# Patient Record
Sex: Male | Born: 1965 | Race: White | Hispanic: No | Marital: Married | Smoking: Never smoker
Health system: Southern US, Community
[De-identification: ages and names within clinical notes are randomized; demographics above are authoritative.]

---

## 2020-01-26 ENCOUNTER — Emergency Department (INDEPENDENT_AMBULATORY_CARE_PROVIDER_SITE_OTHER): Payer: 59

## 2020-01-26 ENCOUNTER — Other Ambulatory Visit: Payer: Self-pay

## 2020-01-26 ENCOUNTER — Emergency Department (INDEPENDENT_AMBULATORY_CARE_PROVIDER_SITE_OTHER)
Admission: RE | Admit: 2020-01-26 | Discharge: 2020-01-26 | Disposition: A | Discharge status: Home / Self Care | Payer: 59 | Source: Ambulatory Visit | Attending: Family Medicine | Admitting: Family Medicine

## 2020-01-26 VITALS — BP 163/92 | HR 94 | Temp 97.8°F | Resp 17

## 2020-01-26 DIAGNOSIS — S2241XA Multiple fractures of ribs, right side, initial encounter for closed fracture: Secondary | ICD-10-CM

## 2020-01-26 DIAGNOSIS — W01198A Fall on same level from slipping, tripping and stumbling with subsequent striking against other object, initial encounter: Secondary | ICD-10-CM | POA: Diagnosis not present

## 2020-01-26 MED ORDER — HYDROCODONE-ACETAMINOPHEN 5-325 MG PO TABS: 2.0000 | ORAL_TABLET | ORAL | 0 refills | Status: AC | PRN

## 2020-01-26 NOTE — ED Triage Notes (Signed)
Pt c/o back pain since Monday night. Says he woke up in middle of night, got up super quick and got dizzy falling into side table. Sharp pains with certain movements. Tylenol prn. Pain 10/10

## 2020-01-26 NOTE — ED Provider Notes (Signed)
[facility]    CSN: [id number] Arrival date & time: 01/26/20  3762      History   Chief Complaint Chief Complaint  Patient presents with  . Back Pain    HPI Howard Wilson is a 54 y.o. male.   Patient suffered a fall 2 nights ago after jumping up out of bed.  Fell against the corner of a dresser or bedside table.  Did not sleep well last night due to pain.  Any cough or sudden jarring hurts in that area.  Some pain with deep breath.  Pain does not radiate  HPI  History reviewed. No pertinent past medical history.  There are no problems to display for this patient.   History reviewed. No pertinent surgical history.     Home Medications    Prior to Admission medications   Not on File    Family History History reviewed. No pertinent family history.  Social History Social History   Tobacco Use  . Smoking status: Never Smoker  . Smokeless tobacco: Never Used  Vaping Use  . Vaping Use: Never used  Substance Use Topics  . Alcohol use: Yes    Comment: occ     Allergies   Patient has no known allergies.   Review of Systems Review of Systems  Musculoskeletal: Positive for back pain.       Right posterior lateral rib area  All other systems reviewed and are negative.    Physical Exam Triage Vital Signs ED Triage Vitals  Enc Vitals Group     BP 01/26/20 0938 (!) 163/92     Pulse Rate 01/26/20 0938 94     Resp 01/26/20 0938 17     Temp 01/26/20 0938 97.8 F (36.6 C)     Temp Source 01/26/20 0938 Oral     SpO2 01/26/20 0938 97 %     Weight --      Height --      Head Circumference --      Peak Flow --      Pain Score 01/26/20 0940 10     Pain Loc --      Pain Edu? --      Excl. in GC? --    No data found.  Updated Vital Signs BP (!) 163/92 (BP Location: Right Arm)   Pulse 94   Temp 97.8 F (36.6 C) (Oral)   Resp 17   SpO2 97%   Visual Acuity Right Eye Distance:   Left Eye Distance:   Bilateral Distance:    Right Eye  Near:   Left Eye Near:    Bilateral Near:     Physical Exam Vitals and nursing note reviewed.  Constitutional:      Appearance: Normal appearance.  HENT:     Head: Normocephalic.  Eyes:     Pupils: Pupils are equal, round, and reactive to light.  Cardiovascular:     Rate and Rhythm: Normal rate and regular rhythm.  Pulmonary:     Effort: Pulmonary effort is normal.     Breath sounds: Normal breath sounds.  Musculoskeletal:     Comments: Back: There is bruising noted and right posterior lateral back as well as point tenderness. There is also tenderness with AP compression Breath sounds are normal  Neurological:     Mental Status: He is alert.      UC Treatments / Results  Labs (all labs ordered are listed, but only abnormal results are displayed) Labs Reviewed - No  data to display  EKG   Radiology No results found.  Procedures Procedures (including critical care time)  Medications Ordered in UC Medications - No data to display  Initial Impression / Assessment and Plan / UC Course  I have reviewed the triage vital signs and the nursing notes.  Pertinent labs & imaging results that were available during my care of the patient were reviewed by me and considered in my medical decision making (see chart for details).     Traumatic fracture of seventh eighth ninth ribs on the right Final Clinical Impressions(s) / UC Diagnoses   Final diagnoses:  None   Discharge Instructions   None    ED Prescriptions    None     PDMP not reviewed this encounter.   Frederica Kuster, MD 01/26/20 1021

## 2020-02-01 ENCOUNTER — Telehealth: Payer: Self-pay | Admitting: Emergency Medicine

## 2020-02-01 MED ORDER — DICLOFENAC SODIUM 75 MG PO TBEC: 75.0000 mg | DELAYED_RELEASE_TABLET | Freq: Two times a day (BID) | ORAL | 0 refills | Status: AC

## 2020-02-01 NOTE — Telephone Encounter (Signed)
Patient is calling for refill of Norco.  Last filled Dec 15.

## 2022-05-30 IMAGING — DX DG RIBS W/ CHEST 3+V*R*
3 series · 3 of 3 positions shown · non-contrast
Comparison: None.

CLINICAL DATA: fall pain R posterolateral rib(s)

EXAM:
RIGHT RIBS AND CHEST - 3+ VIEW

[chest pa]
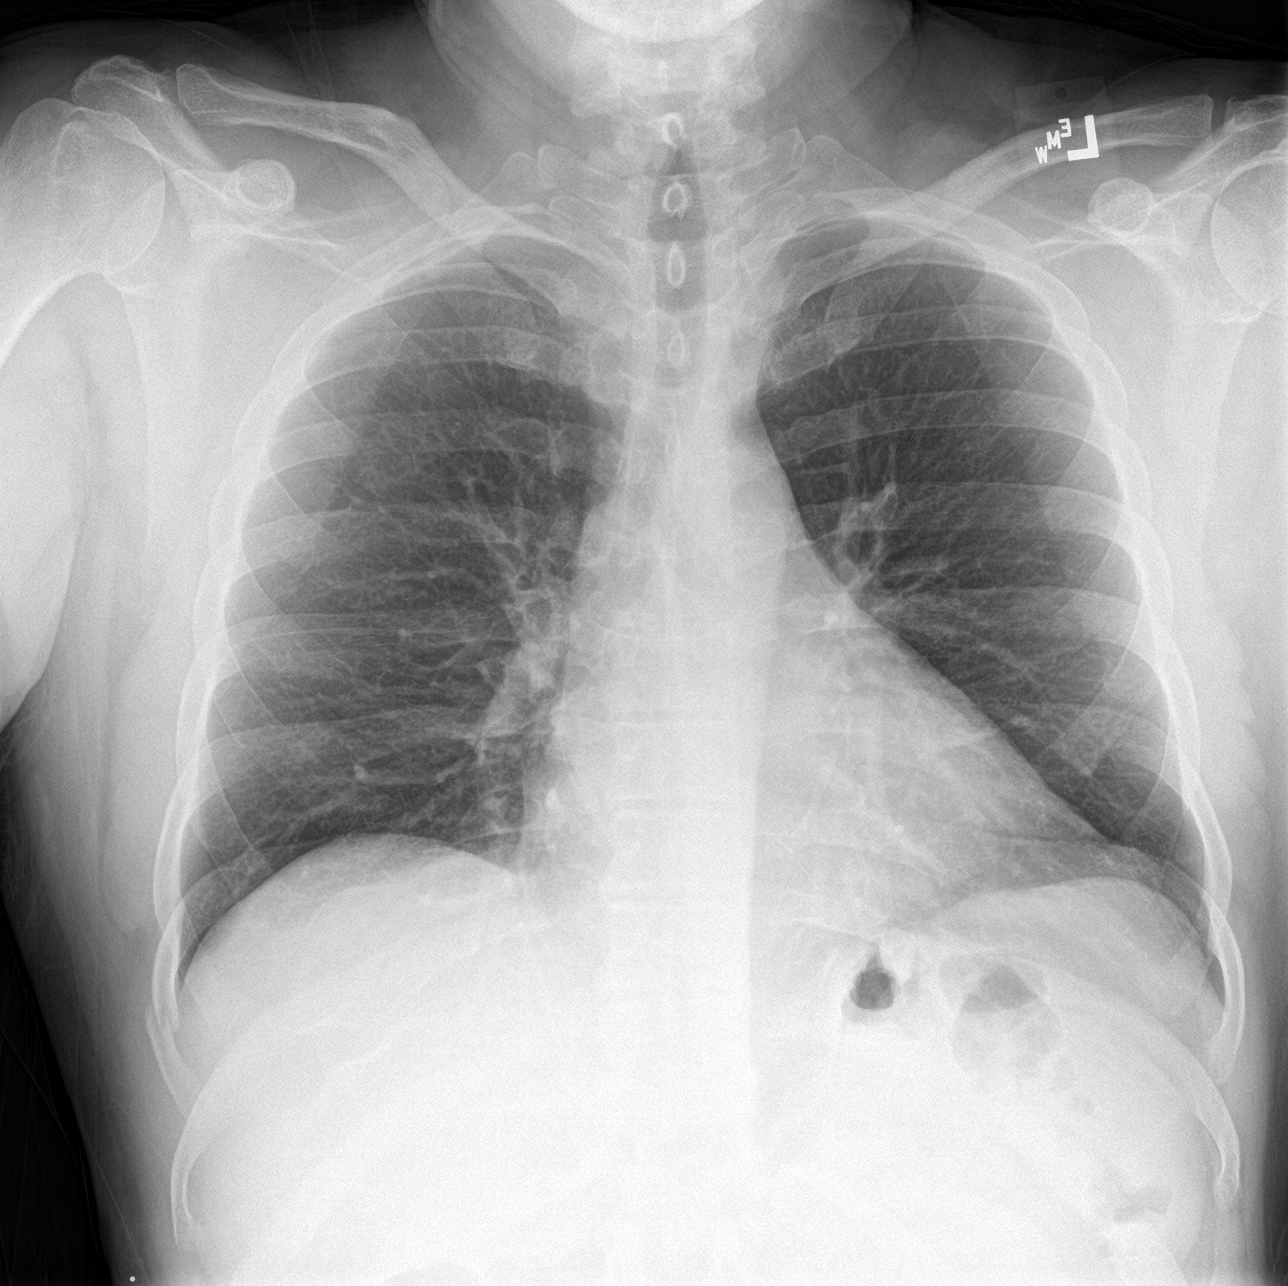

[rib ap]
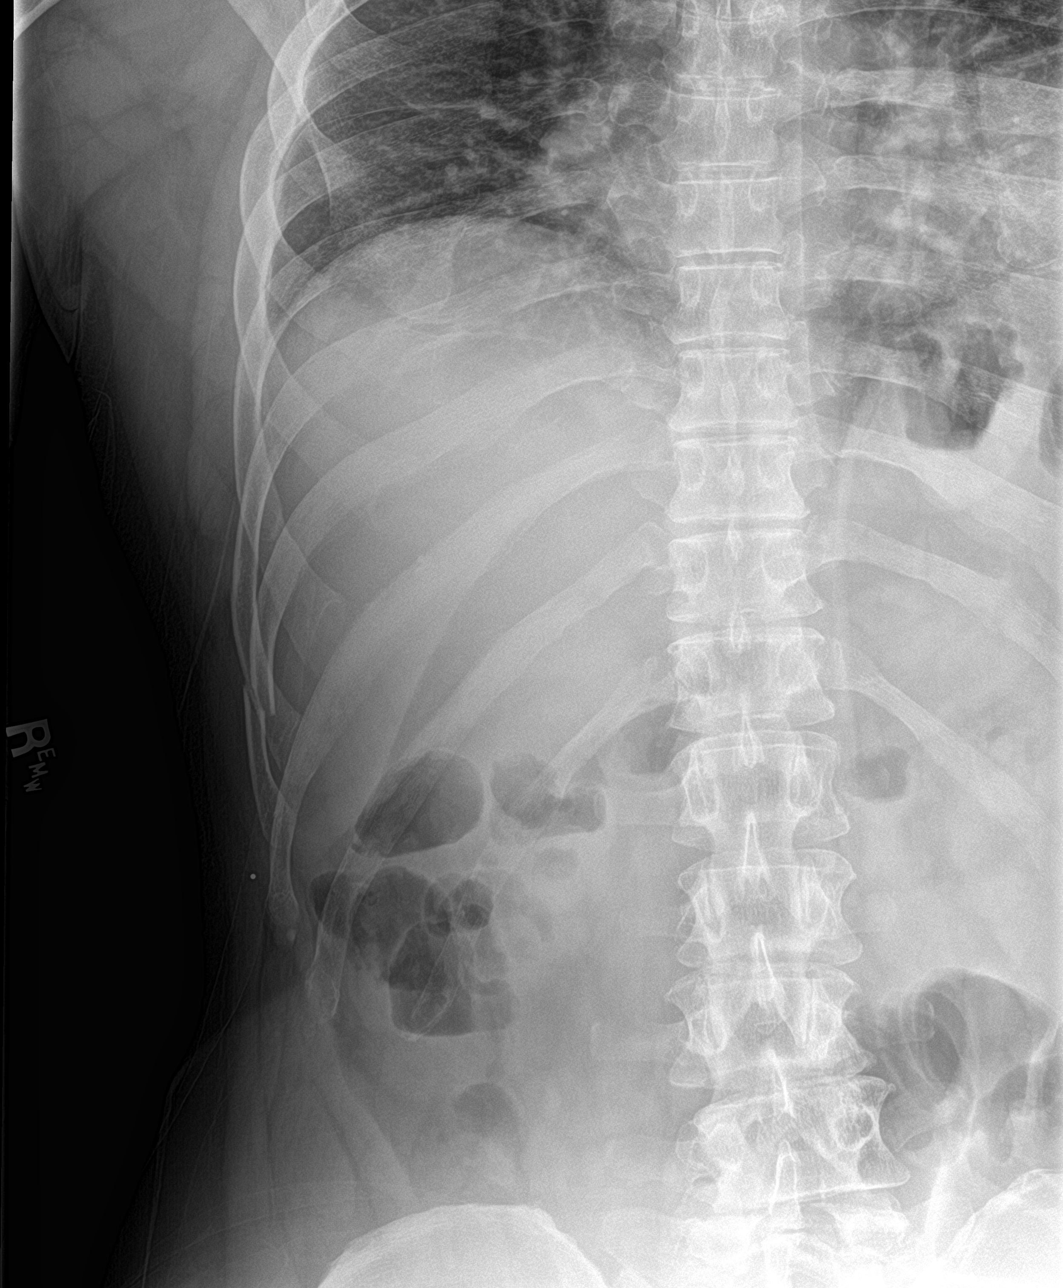

[rib ap obl]
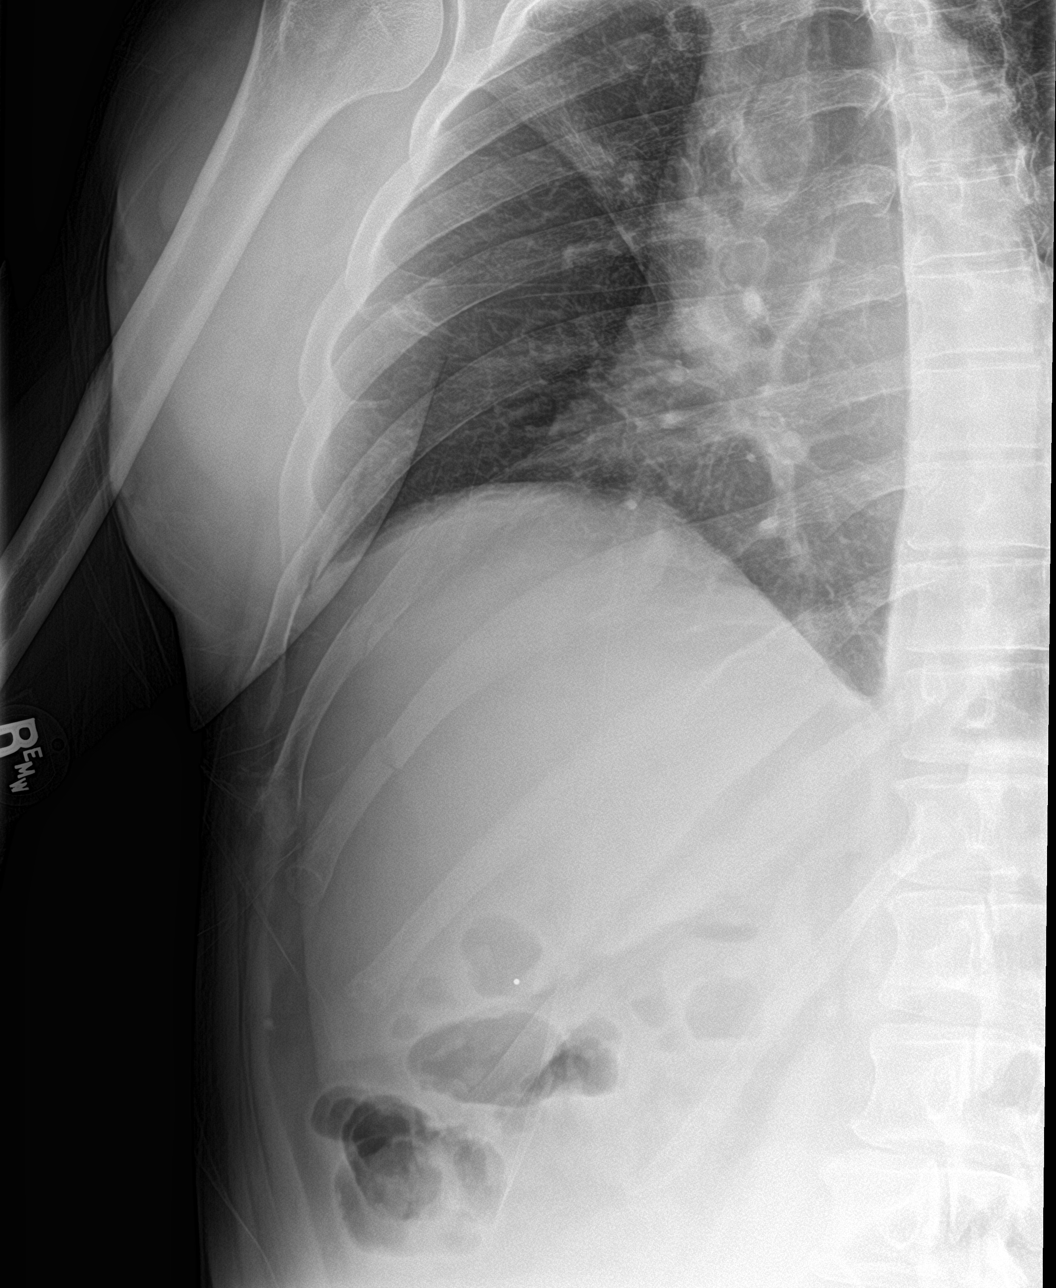

[3 of 3 positions shown; findings below may reference images not displayed]

FINDINGS: Minimally displaced lateral right ninth rib fracture. Nondisplaced
lateral right seventh and eighth rib fractures. There is no evidence
of pneumothorax or pleural effusion. Both lungs are clear. Heart
size and mediastinal contours are within normal limits.
IMPRESSION: Minimally displaced fracture of the lateral right ninth rib.
Nondisplaced lateral right seventh and eighth rib fractures.

No focal airspace disease.
